# Patient Record
Sex: Male | Born: 1970 | Race: White | Hispanic: No | Marital: Single | Smoking: Former smoker
Health system: Southern US, Community
[De-identification: ages and names within clinical notes are randomized; demographics above are authoritative.]

## PROBLEM LIST (undated history)

## (undated) DIAGNOSIS — M1990 Unspecified osteoarthritis, unspecified site: Secondary | ICD-10-CM

## (undated) HISTORY — DX: Unspecified osteoarthritis, unspecified site: M19.90

---

## 2000-12-16 ENCOUNTER — Ambulatory Visit (HOSPITAL_BASED_OUTPATIENT_CLINIC_OR_DEPARTMENT_OTHER): Admission: RE | Admit: 2000-12-16 | Discharge: 2000-12-16 | Payer: Self-pay | Admitting: Orthopedic Surgery

## 2001-01-20 ENCOUNTER — Ambulatory Visit (HOSPITAL_BASED_OUTPATIENT_CLINIC_OR_DEPARTMENT_OTHER): Admission: RE | Admit: 2001-01-20 | Discharge: 2001-01-20 | Payer: Self-pay | Admitting: Orthopedic Surgery

## 2002-03-28 ENCOUNTER — Encounter: Payer: Self-pay | Admitting: Preventative Medicine

## 2002-03-28 ENCOUNTER — Ambulatory Visit (HOSPITAL_COMMUNITY): Admission: RE | Admit: 2002-03-28 | Discharge: 2002-03-28 | Payer: Self-pay | Admitting: Preventative Medicine

## 2003-09-24 ENCOUNTER — Emergency Department (HOSPITAL_COMMUNITY): Admission: EM | Admit: 2003-09-24 | Discharge: 2003-09-24 | Payer: Self-pay | Admitting: Emergency Medicine

## 2003-09-28 ENCOUNTER — Emergency Department (HOSPITAL_COMMUNITY): Admission: EM | Admit: 2003-09-28 | Discharge: 2003-09-28 | Payer: Self-pay | Admitting: Emergency Medicine

## 2003-10-31 ENCOUNTER — Emergency Department (HOSPITAL_COMMUNITY): Admission: EM | Admit: 2003-10-31 | Discharge: 2003-11-01 | Payer: Self-pay | Admitting: Emergency Medicine

## 2003-11-09 ENCOUNTER — Ambulatory Visit (HOSPITAL_COMMUNITY): Admission: RE | Admit: 2003-11-09 | Discharge: 2003-11-09 | Payer: Self-pay | Admitting: Orthopaedic Surgery
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
## 2008-04-11 ENCOUNTER — Emergency Department (HOSPITAL_COMMUNITY): Admission: EM | Admit: 2008-04-11 | Discharge: 2008-04-11 | Payer: Self-pay | Admitting: Emergency Medicine

## 2010-06-23 ENCOUNTER — Encounter: Payer: Self-pay | Admitting: Preventative Medicine

## 2010-10-18 NOTE — Op Note (Signed)
[place]. [facility]  Patient:    Greg Stewart, Greg Stewart                           MRN: [id number] Proc. Date: 12/16/00 Adm. Date:  [id number] Attending:  Marlowe Shores                           Operative Report  PREOPERATIVE DIAGNOSIS: Left hand fourth and fifth digit volar laceration, zone 2.  POSTOPERATIVE DIAGNOSIS: Left hand fourth and fifth digit volar laceration, zone 2.  OPERATION/PROCEDURE:  1. Repair of flexor digitorum profundus tendon, left ring finger, zone 2.  2. Repair of flexor digitorum superficialis tendon and profundus tendon, left     fifth digit, zone 2.  SURGEON: Artist Pais. Mina Marble, M.D.  ASSISTANT: Junius Roads. Ireton, P.A.C.  ANESTHESIA: General.  TOURNIQUET TIME: One hour and 30 minutes.  COMPLICATIONS: None.  DRAINS: None.  DESCRIPTION OF PROCEDURE: The patient was taken to the operating room and after the induction of adequate general anesthesia the left upper extremity was prepped and draped in the usual sterile fashion.  An Esmarch was used to exsanguinate the limb and the tourniquet inflated to 250 mm Hg.  At this point in time transverse lacerations in the area of the PIP flexion creases were extended in mid lateral fashion of both the fourth and fifth digits. Examination of the fourth digit revealed complete severance of the profundus tendon in zone 2.  This was repaired using a 3-0 double-arm Ethibond suture in a Tagima type stitch with a 6-0 Prolene epitendinous stitch.  After this was done our attention was then turned to the fifth digit, where the superficialis tendon and the profundus tendon were repaired in similar manner using 3-0 double-arm Ethibond.  The superficialis slips were repaired using 4-0 Mersilene horizontal mattress sutures, both having 6-0 Prolene epitendinous stitches.  The wounds were then thoroughly irrigated and closed with 5-0 Nylon.  A sterile dressing of Xeroform, 4 x 4s, fluffs, and a dorsal  extension lock splint with the MP and PIP joints flexed as well as the wrist flexed was applied.  The patient tolerated the procedure well and went to the recovery room in stable fashion.DD:  12/16/00 TD:  12/16/00 Job: [id number]

## 2010-10-18 NOTE — Op Note (Signed)
[place]. [facility]  Patient:    Greg Stewart, Greg Stewart Visit Number: [id number] MRN: [id number]          Service Type: DSU Location: [facility] Attending Physician:  Marlowe Shores Dictated by:   Artist Pais Mina Marble, M.D. Proc. Date: 01/20/01 Adm. Date:  01/20/2001                             Operative Report  PREOPERATIVE DIAGNOSIS:  Rupture of left ring finger profundus repair, one month status post primary repair.  POSTOPERATIVE DIAGNOSIS:  Rupture of left ring finger profundus repair, one month status post primary repair.  PROCEDURE:  Repair of profundus tendon and reconstruction of A4 pulley.  SURGEON:  Artist Pais. Mina Marble, M.D.  ASSISTANT:  Junius Roads. Ireton, P.A.C.  ANESTHESIA:  General.  TOURNIQUET TIME:  1 hour 21 minutes.  COMPLICATIONS:  None.  DRAINS:  None.  DESCRIPTION OF PROCEDURE:  The patient was taken to the operating room.  After the induction of adequate general anesthesia, the left upper extremity was prepped and draped in the usual sterile fashion.  An Esmarch was used to exsanguinate the limb, and the tourniquet was then inflated to 250 mmHg.  At this point in time, using his previous midlateral incision with an extension of a transverse laceration of the PIP flexion crease, the wound was opened, and a large ulnarly based flap was elevated.  Dissection was carried down to the neurovascular bundles on both the radial and ulnar sides which were carefully dissected free from underlying scar and retracted appropriately. Once this was done, the sheath between the A2 and A4 pulleys was incised revealing a ruptured profundus tendon between A4 and A2 pulleys.  The pseudotendon that had been formed between the two ends was excised, and a tenolysis was performed of the distal tendon stump until good gliding was evident, and DIP flexion was achievable.  At this point in time using the right medical Pactan approximating device, the tendon was  repaired using a 6-0 Prolene epitendinous stitch on both the dorsal and volar sides, followed by a 3-0 double armed Ethibond Tajima suture for the primary core suture.  After this was done, the A4 pulley was reconstructed using local tissues, using 4-0 Mersilene x 3 sutures.  The wound was then thoroughly irrigated.  Hemostasis was achieved with bipolar cautery and was closed loosely with 5-0 Nylon in a combination of simple and horizontal mattress sutures.  A sterile dressing with xeroform, 4 x 4, fluffs, and a dorsal extension block splint with the wrist, MP, and PIP joints in flexion was applied.  The patient tolerated the procedure well, went to recovery room in a stable fashion. Dictated by:   Artist Pais Mina Marble, M.D. Attending Physician:  Marlowe Shores DD:  01/20/01 TD:  01/20/01 Job: [id number]

## 2013-11-02 ENCOUNTER — Telehealth: Payer: Self-pay | Admitting: Family Medicine

## 2013-11-02 NOTE — Telephone Encounter (Signed)
appt given for tomorrow afternoon

## 2013-11-03 ENCOUNTER — Ambulatory Visit (INDEPENDENT_AMBULATORY_CARE_PROVIDER_SITE_OTHER): Payer: 59 | Admitting: Nurse Practitioner

## 2013-11-03 ENCOUNTER — Encounter (INDEPENDENT_AMBULATORY_CARE_PROVIDER_SITE_OTHER): Payer: Self-pay

## 2013-11-03 ENCOUNTER — Encounter: Payer: Self-pay | Admitting: Nurse Practitioner

## 2013-11-03 VITALS — BP 118/76 | HR 83 | Temp 98.6°F | Ht 73.5 in | Wt 173.4 lb

## 2013-11-03 DIAGNOSIS — W57XXXA Bitten or stung by nonvenomous insect and other nonvenomous arthropods, initial encounter: Principal | ICD-10-CM

## 2013-11-03 DIAGNOSIS — S90569A Insect bite (nonvenomous), unspecified ankle, initial encounter: Secondary | ICD-10-CM

## 2013-11-03 DIAGNOSIS — S80861A Insect bite (nonvenomous), right lower leg, initial encounter: Secondary | ICD-10-CM

## 2013-11-03 MED ORDER — DOXYCYCLINE HYCLATE 100 MG PO TABS: 100.0000 mg | ORAL_TABLET | Freq: Two times a day (BID) | ORAL | Status: AC

## 2013-11-03 NOTE — Patient Instructions (Signed)
Tick Bite Information Ticks are insects that attach themselves to the skin and draw blood for food. There are various types of ticks. Common types include wood ticks and deer ticks. Most ticks live in shrubs and grassy areas. Ticks can climb onto your body when you make contact with leaves or grass where the tick is waiting. The most common places on the body for ticks to attach themselves are the scalp, neck, armpits, waist, and groin. Most tick bites are harmless, but sometimes ticks carry germs that cause diseases. These germs can be spread to a person during the tick's feeding process. The chance of a disease spreading through a tick bite depends on:   The type of tick.  Time of year.   How long the tick is attached.   Geographic location.  HOW CAN YOU PREVENT TICK BITES? Take these steps to help prevent tick bites when you are outdoors:  Wear protective clothing. Long sleeves and long pants are best.   Wear white clothes so you can see ticks more easily.  Tuck your pant legs into your socks.   If walking on a trail, stay in the middle of the trail to avoid brushing against bushes.  Avoid walking through areas with long grass.  Put insect repellent on all exposed skin and along boot tops, pant legs, and sleeve cuffs.   Check clothing, hair, and skin repeatedly and before going inside.   Brush off any ticks that are not attached.  Take a shower or bath as soon as possible after being outdoors.  WHAT IS THE PROPER WAY TO REMOVE A TICK? Ticks should be removed as soon as possible to help prevent diseases caused by tick bites. 1. If latex gloves are available, put them on before trying to remove a tick.  2. Using fine-point tweezers, grasp the tick as close to the skin as possible. You may also use curved forceps or a tick removal tool. Grasp the tick as close to its head as possible. Avoid grasping the tick on its body. 3. Pull gently with steady upward pressure until  the tick lets go. Do not twist the tick or jerk it suddenly. This may break off the tick's head or mouth parts. 4. Do not squeeze or crush the tick's body. This could force disease-carrying fluids from the tick into your body.  5. After the tick is removed, wash the bite area and your hands with soap and water or other disinfectant such as alcohol. 6. Apply a small amount of antiseptic cream or ointment to the bite site.  7. Wash and disinfect any instruments that were used.  Do not try to remove a tick by applying a hot match, petroleum jelly, or fingernail polish to the tick. These methods do not work and may increase the chances of disease being spread from the tick bite.  WHEN SHOULD YOU SEEK MEDICAL CARE? Contact your health care provider if you are unable to remove a tick from your skin or if a part of the tick breaks off and is stuck in the skin.  After a tick bite, you need to be aware of signs and symptoms that could be related to diseases spread by ticks. Contact your health care provider if you develop any of the following in the days or weeks after the tick bite:  Unexplained fever.  Rash. A circular rash that appears days or weeks after the tick bite may indicate the possibility of Lyme disease. The rash may resemble   a target with a bull's-eye and may occur at a different part of your body than the tick bite.  Redness and swelling in the area of the tick bite.   Tender, swollen lymph glands.   Diarrhea.   Weight loss.   Cough.   Fatigue.   Muscle, joint, or bone pain.   Abdominal pain.   Headache.   Lethargy or a change in your level of consciousness.  Difficulty walking or moving your legs.   Numbness in the legs.   Paralysis.  Shortness of breath.   Confusion.   Repeated vomiting.  Document Released: 05/16/2000 Document Revised: 03/09/2013 Document Reviewed: 10/27/2012 [employer] Patient Information 2014 [employer].  

## 2013-11-03 NOTE — Addendum Note (Signed)
Addended by: Bennie Pierini on: 11/03/2013 03:14 PM   Modules accepted: Level of Service

## 2013-11-03 NOTE — Progress Notes (Signed)
   Subjective:    Patient ID: Greg Stewart, male    DOB: 01/27/1971, 43 y.o.   MRN: [id number]  HPI Patient in today c/o tick bite- on right ankle- on there less than 24hrs. No C/o fatigue. No fever.    Review of Systems  Constitutional: Negative.   HENT: Negative.   Respiratory: Negative.   Cardiovascular: Negative.   Genitourinary: Negative.   Neurological: Negative.   Psychiatric/Behavioral: Negative.   All other systems reviewed and are negative.      Objective:   Physical Exam  Constitutional: He appears well-developed and well-nourished.  Cardiovascular: Normal rate, regular rhythm and normal heart sounds.   Pulmonary/Chest: Effort normal and breath sounds normal.  Skin:  Erythematous macular area on right medial ankle.   BP 118/76  Pulse 83  Temp(Src) 98.6 F (37 C) (Oral)  Ht 6' 1.5" (1.867 m)  Wt 173 lb 6.4 oz (78.654 kg)  BMI 22.56 kg/m2        Assessment & Plan:  1. Tick bite of right lower leg Avoid scratching RTO prn - doxycycline (VIBRA-TABS) 100 MG tablet; Take 1 tablet (100 mg total) by mouth 2 (two) times daily.  Dispense: 28 tablet; Refill: 0  Mary-Margaret Daphine Deutscher, FNP

## 2024-03-25 HISTORY — PX: HAND SURGERY: SHX662

## 2024-05-19 ENCOUNTER — Ambulatory Visit

## 2024-05-19 VITALS — Ht 75.0 in | Wt 206.8 lb

## 2024-05-19 DIAGNOSIS — Z1211 Encounter for screening for malignant neoplasm of colon: Secondary | ICD-10-CM

## 2024-05-19 MED ORDER — NA SULFATE-K SULFATE-MG SULF 17.5-3.13-1.6 GM/177ML PO SOLN: 1.0000 | Freq: Once | ORAL | 0 refills | Status: AC

## 2024-05-19 NOTE — Progress Notes (Signed)
 No egg or soy allergy known to patient  No issues known to pt with past sedation with any surgeries or procedures Patient denies ever being told they had issues or difficulty with intubation  No FH of Malignant Hyperthermia Pt is not on diet pills Pt is not on  home 02  Pt is not on blood thinners  Pt denies issues with constipation  No A fib or A flutter Have any cardiac testing pending--NO Pt can ambulate- Independently Pt denies use of chewing tobacco Discussed diabetic I weight loss medication holds Discussed NSAID holds Checked BMI Pt instructed to use Single[employer].com or GoodRx for a price reduction on prep  Patient's chart reviewed by Greg Stewart CNRA prior to previsit and patient appropriate for the [facility].  Pre visit completed and red dot placed by patient's name on their procedure day (on provider's schedule).

## 2024-06-13 ENCOUNTER — Encounter: Payer: Self-pay | Admitting: Internal Medicine

## 2024-06-20 NOTE — Progress Notes (Unsigned)
 [facility] History and Physical   Primary Care Physician:  Gladis Mustard, FNP   Reason for Procedure:    Encounter Diagnosis  Name Primary?   Colon cancer screening Yes     Plan:    colonoscopy   The patient was provided an opportunity to ask questions and all were answered. The patient agreed with the plan.   HPI: Greg Stewart is a 54 y.o. 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     celecoxib (CELEBREX) 100 MG capsule Take 100 mg by mouth daily.     doxycycline  (VIBRA -TABS) 100 MG tablet Take 1 tablet (100 mg total) by mouth 2 (two) times daily. (Patient not taking: No sig reported) 28 tablet 0   Current Facility-Administered Medications  Medication Dose Route Frequency Provider Last Rate Last Admin   0.9 %  sodium chloride  infusion  500 mL Intravenous Once Avram Lupita BRAVO, MD        Allergies as of 06/21/2024   (No Known Allergies)    Family History  Problem Relation Age of Onset   Breast cancer Mother    Lymphoma Father    Lung cancer Father    Colon polyps Brother    Colon cancer Neg Hx    Esophageal cancer Neg Hx    Rectal cancer Neg Hx    Stomach cancer Neg Hx     Social History   Socioeconomic History   Marital status: Single    Spouse name: Not on file   Number of children: Not on file   Years of education: Not on file   Highest education level: Not on file  Occupational History   Not on file  Tobacco Use   Smoking status: Former    Types: Cigarettes   Smokeless tobacco: Never   Tobacco comments:    Quit smoking in 2018  Vaping Use   Vaping status: Never Used  Substance and Sexual  Activity   Alcohol use: Yes   Drug use: No   Sexual activity: Not on file  Other Topics Concern   Not on file  Social History Narrative   Not on file   Social Drivers of Health   Tobacco Use: Medium Risk (06/21/2024)   Patient History    Smoking Tobacco Use: Former    Smokeless Tobacco Use: Never    Passive Exposure: Not on Actuary Strain: Not on file  Food Insecurity: Not on file  Transportation Needs: Not on file  Physical Activity: Not on file  Stress: Not on file  Social Connections: Not on file  Intimate Partner Violence: Not on file  Depression (EYV7-0): Not on file  Alcohol Screen: Not on file  Housing: Not on file  Utilities: Not on file  Health Literacy: Not on file    Review of Systems:  All other review of systems negative except as mentioned in the HPI.  Physical Exam: Vital signs BP 117/80   Pulse 86   Temp 98 F (36.7 C) (Temporal)   Ht 6' 3 (1.905 m)   Wt 206 lb (93.4 kg)   SpO2 98%  BMI 25.75 kg/m   General:   Alert,  Well-developed, well-nourished, pleasant and cooperative in NAD Lungs:  Clear throughout to auscultation.   Heart:  Regular rate and rhythm; no murmurs, clicks, rubs,  or gallops. Abdomen:  Soft, nontender and nondistended. Normal bowel sounds.   Neuro/Psych:  Alert and cooperative. Normal mood and affect. A and O x 3   @Topanga Alvelo  CHARLENA Commander, MD, [facility] [phone] (pager) 06/21/2024 9:27 AM@

## 2024-06-21 ENCOUNTER — Ambulatory Visit: Payer: Self-pay | Admitting: Internal Medicine

## 2024-06-21 ENCOUNTER — Encounter: Payer: Self-pay | Admitting: Internal Medicine

## 2024-06-21 VITALS — BP 110/75 | HR 72 | Temp 98.0°F | Resp 16 | Ht 75.0 in | Wt 206.0 lb

## 2024-06-21 DIAGNOSIS — D122 Benign neoplasm of ascending colon: Secondary | ICD-10-CM

## 2024-06-21 DIAGNOSIS — D123 Benign neoplasm of transverse colon: Secondary | ICD-10-CM

## 2024-06-21 DIAGNOSIS — Z1211 Encounter for screening for malignant neoplasm of colon: Secondary | ICD-10-CM | POA: Diagnosis present

## 2024-06-21 DIAGNOSIS — D125 Benign neoplasm of sigmoid colon: Secondary | ICD-10-CM

## 2024-06-21 DIAGNOSIS — Z860101 Personal history of adenomatous and serrated colon polyps: Secondary | ICD-10-CM | POA: Insufficient documentation

## 2024-06-21 DIAGNOSIS — K5730 Diverticulosis of large intestine without perforation or abscess without bleeding: Secondary | ICD-10-CM | POA: Diagnosis not present

## 2024-06-21 DIAGNOSIS — K635 Polyp of colon: Secondary | ICD-10-CM | POA: Diagnosis not present

## 2024-06-21 MED ORDER — SODIUM CHLORIDE 0.9 % IV SOLN: 500.0000 mL | Freq: Once | INTRAVENOUS | Status: AC

## 2024-06-21 NOTE — Progress Notes (Signed)
 Sedate, gd SR, tolerated procedure well, VSS, report to RN

## 2024-06-21 NOTE — Progress Notes (Deleted)
 EKG abnormality - p waves were inverted and upright  Photos of tracings in media tab - photos  12 lead done

## 2024-06-21 NOTE — Progress Notes (Signed)
 Called to room to assist during endoscopic procedure.  Patient ID and intended procedure confirmed with present staff. Received instructions for my participation in the procedure from the performing physician.

## 2024-06-21 NOTE — Patient Instructions (Addendum)
 Multiple polyps removed today. They look benign.  One was on the large side.  I will have them analyzed and let you know when to repeat a routine colonoscopy.  You do have diverticulosis - thickened muscle rings and pouches in the colon wall. Please read the handout about this condition.  I appreciate the opportunity to care for you. Lupita CHARLENA Commander, MD, FACG  YOU HAD AN ENDOSCOPIC PROCEDURE TODAY AT THE [facility]:   Refer to the procedure report that was given to you for any specific questions about what was found during the examination.  If the procedure report does not answer your questions, please call your gastroenterologist to clarify.  If you requested that your care partner not be given the details of your procedure findings, then the procedure report has been included in a sealed envelope for you to review at your convenience later.  YOU SHOULD EXPECT: Some feelings of bloating in the abdomen. Passage of more gas than usual.  Walking can help get rid of the air that was put into your GI tract during the procedure and reduce the bloating. If you had a lower endoscopy (such as a colonoscopy or flexible sigmoidoscopy) you may notice spotting of blood in your stool or on the toilet paper. If you underwent a bowel prep for your procedure, you may not have a normal bowel movement for a few days.  Please Note:  You might notice some irritation and congestion in your nose or some drainage.  This is from the oxygen used during your procedure.  There is no need for concern and it should clear up in a day or so.  SYMPTOMS TO REPORT IMMEDIATELY:  Following lower endoscopy (colonoscopy or flexible sigmoidoscopy):  Excessive amounts of blood in the stool  Significant tenderness or worsening of abdominal pains  Swelling of the abdomen that is new, acute  Fever of 100F or higher  For urgent or emergent issues, a gastroenterologist can be reached at any hour by calling [phone]  [phone]. Do not use MyChart messaging for urgent concerns.    DIET:  We do recommend a small meal at first, but then you may proceed to your regular diet.  Drink plenty of fluids but you should avoid alcoholic beverages for 24 hours.  ACTIVITY:  You should plan to take it easy for the rest of today and you should NOT DRIVE or use heavy machinery until tomorrow (because of the sedation medicines used during the test).    FOLLOW UP: Our staff will call the number listed on your records the next business day following your procedure.  We will call around 7:15- 8:00 am to check on you and address any questions or concerns that you may have regarding the information given to you following your procedure. If we do not reach you, we will leave a message.     If any biopsies were taken you will be contacted by phone or by letter within the next 1-3 weeks.  Please call us  at [phone] if you have not heard about the biopsies in 3 weeks.    SIGNATURES/CONFIDENTIALITY: You and/or your care partner have signed paperwork which will be entered into your electronic medical record.  These signatures attest to the fact that that the information above on your After Visit Summary has been reviewed and is understood.  Full responsibility of the confidentiality of this discharge information lies with you and/or your care-partner.

## 2024-06-21 NOTE — Progress Notes (Signed)
 Pt's states no medical or surgical changes since previsit or office visit.

## 2024-06-21 NOTE — Op Note (Signed)
 [facility] Patient Name: Greg Stewart Procedure Date: 06/21/2024 9:21 AM MRN: [id number] Endoscopist: Lupita FORBES Commander , MD, [id number] Age: 54 Referring MD:  Date of Birth: 10-04-1970 Gender: Male Account #: [id number] Procedure:                Colonoscopy Indications:              Screening for colorectal malignant neoplasm, This                            is the patient's first colonoscopy Medicines:                Monitored Anesthesia Care Procedure:                Pre-Anesthesia Assessment:                           - Prior to the procedure, a History and Physical                            was performed, and patient medications and                            allergies were reviewed. The patient's tolerance of                            previous anesthesia was also reviewed. The risks                            and benefits of the procedure and the sedation                            options and risks were discussed with the patient.                            All questions were answered, and informed consent                            was obtained. Prior Anticoagulants: The patient has                            taken no anticoagulant or antiplatelet agents. ASA                            Grade Assessment: I - A normal, healthy patient.                            After reviewing the risks and benefits, the patient                            was deemed in satisfactory condition to undergo the                            procedure.  After obtaining informed consent, the colonoscope                            was passed under direct vision. Throughout the                            procedure, the patient's blood pressure, pulse, and                            oxygen saturations were monitored continuously. The                            CF [id number] #[id number] was introduced through the anus                            and advanced to the the cecum,  identified by                            appendiceal orifice and ileocecal valve. The                            colonoscopy was performed without difficulty. The                            patient tolerated the procedure well. The quality                            of the bowel preparation was good. The ileocecal                            valve, appendiceal orifice, and rectum were                            photographed. The bowel preparation used was SUPREP                            via split dose instruction. Scope In: 9:40:50 AM Scope Out: 10:04:39 AM Scope Withdrawal Time: 0 hours 19 minutes 57 seconds  Total Procedure Duration: 0 hours 23 minutes 49 seconds  Findings:                 The perianal and digital rectal examinations were                            normal. Pertinent negatives include normal prostate                            (size, shape, and consistency).                           A 20 mm polyp was found in the distal transverse                            colon. The polyp was semi-pedunculated. The polyp  was removed with a hot snare. Resection and                            retrieval were complete. Verification of patient                            identification for the specimen was done. Estimated                            blood loss: none.                           Six sessile polyps were found in the sigmoid colon,                            transverse colon and ascending colon. The polyps                            were 4 to 7 mm in size. These polyps were removed                            with a cold snare. Resection and retrieval were                            complete. Verification of patient identification                            for the specimen was done. Estimated blood loss was                            minimal.                           Multiple diverticula were found in the sigmoid                            colon.                            The exam was otherwise without abnormality on                            direct and retroflexion views. Complications:            No immediate complications. Estimated Blood Loss:     Estimated blood loss was minimal. Impression:               - One 20 mm polyp in the distal transverse colon,                            removed with a hot snare. Resected and retrieved.                           - Six 4 to 7 mm polyps in the sigmoid colon, in the  transverse colon and in the ascending colon,                            removed with a cold snare. Resected and retrieved.                           - Diverticulosis in the sigmoid colon.                           - The examination was otherwise normal on direct                            and retroflexion views. Recommendation:           - Patient has a contact number available for                            emergencies. The signs and symptoms of potential                            delayed complications were discussed with the                            patient. Return to normal activities tomorrow.                            Written discharge instructions were provided to the                            patient.                           - Resume previous diet.                           - Continue present medications.                           - No aspirin, ibuprofen, naproxen, or other                            non-steroidal anti-inflammatory drugs for 2 weeks                            after polyp removal. Lupita FORBES Commander, MD 06/21/2024 10:18:26 AM This report has been signed electronically.

## 2024-06-22 ENCOUNTER — Telehealth: Payer: Self-pay

## 2024-06-22 NOTE — Telephone Encounter (Signed)
" °  Follow up Call-     06/21/2024    7:58 AM  Call back number  Post procedure Call Back phone  # [phone]  Permission to leave phone message Yes     Patient questions:  Do you have a fever, pain , or abdominal swelling? No. Pain Score  0 *  Have you tolerated food without any problems? Yes.    Have you been able to return to your normal activities? Yes.    Do you have any questions about your discharge instructions: Diet   No. Medications  No. Follow up visit  No.  Do you have questions or concerns about your Care? No.  Actions: * If pain score is 4 or above: No action needed, pain <4.   "

## 2024-06-28 LAB — SURGICAL PATHOLOGY

## 2024-07-01 ENCOUNTER — Telehealth: Payer: Self-pay | Admitting: Internal Medicine

## 2024-07-01 NOTE — Telephone Encounter (Signed)
 Incoming call from pt regarding Rx celecoxib. Pt stated he has questions about taking the medication due to pain and inflammation.   Pt was informed not to take after procedure. Please advise. Thank you

## 2024-07-01 NOTE — Telephone Encounter (Signed)
 I spoke with Greg Stewart his wife and informed her to tell him to take Tylenol for his wrist pain. He was taking the celebrex for it and I told her Dr Avram doesn't want him to take that for 2 weeks post procedure. He can start that back 07/06/2024. She verbalized understanding.    She asked if the biopsy report was back. They don't have MYCHART but are going to try and set it up. I told her it is back and Dr Avram will be back next week to see those results and we will be in touch.

## 2024-07-07 ENCOUNTER — Ambulatory Visit: Payer: Self-pay | Admitting: Internal Medicine

## 2024-07-07 DIAGNOSIS — Z860101 Personal history of adenomatous and serrated colon polyps: Secondary | ICD-10-CM
